# Patient Record
Sex: Female | Born: 1935 | Race: White | Hispanic: No | Smoking: Former smoker
Health system: Southern US, Community
[De-identification: ages and names within clinical notes are randomized; demographics above are authoritative.]

## PROBLEM LIST (undated history)

## (undated) DIAGNOSIS — L570 Actinic keratosis: Secondary | ICD-10-CM

## (undated) DIAGNOSIS — M8580 Other specified disorders of bone density and structure, unspecified site: Secondary | ICD-10-CM

## (undated) DIAGNOSIS — A498 Other bacterial infections of unspecified site: Secondary | ICD-10-CM

## (undated) DIAGNOSIS — G459 Transient cerebral ischemic attack, unspecified: Secondary | ICD-10-CM

## (undated) DIAGNOSIS — E785 Hyperlipidemia, unspecified: Secondary | ICD-10-CM

## (undated) DIAGNOSIS — N182 Chronic kidney disease, stage 2 (mild): Secondary | ICD-10-CM

## (undated) DIAGNOSIS — H9190 Unspecified hearing loss, unspecified ear: Secondary | ICD-10-CM

## (undated) HISTORY — DX: Transient cerebral ischemic attack, unspecified: G45.9

## (undated) HISTORY — DX: Other specified disorders of bone density and structure, unspecified site: M85.80

## (undated) HISTORY — DX: Unspecified hearing loss, unspecified ear: H91.90

## (undated) HISTORY — PX: VESICOVAGINAL FISTULA CLOSURE W/ TAH: SUR271

## (undated) HISTORY — DX: Chronic kidney disease, stage 2 (mild): N18.2

## (undated) HISTORY — DX: Actinic keratosis: L57.0

## (undated) HISTORY — PX: APPENDECTOMY: SHX54

## (undated) HISTORY — DX: Hyperlipidemia, unspecified: E78.5

## (undated) HISTORY — PX: BASAL CELL CARCINOMA EXCISION: SHX1214

## (undated) HISTORY — DX: Other bacterial infections of unspecified site: A49.8

---

## 1999-12-12 ENCOUNTER — Encounter: Payer: Self-pay | Admitting: *Deleted

## 1999-12-12 ENCOUNTER — Encounter: Admission: RE | Admit: 1999-12-12 | Discharge: 1999-12-12 | Payer: Self-pay | Admitting: *Deleted

## 2003-12-15 ENCOUNTER — Ambulatory Visit (HOSPITAL_COMMUNITY): Admission: RE | Admit: 2003-12-15 | Discharge: 2003-12-15 | Payer: Self-pay | Admitting: Gastroenterology

## 2006-06-18 ENCOUNTER — Encounter: Admission: RE | Admit: 2006-06-18 | Discharge: 2006-06-18 | Payer: Self-pay | Admitting: Internal Medicine

## 2006-09-03 ENCOUNTER — Ambulatory Visit: Payer: Self-pay | Admitting: Ophthalmology

## 2006-10-01 ENCOUNTER — Ambulatory Visit: Payer: Self-pay | Admitting: Ophthalmology

## 2012-12-05 ENCOUNTER — Encounter (HOSPITAL_COMMUNITY): Payer: Self-pay

## 2012-12-12 ENCOUNTER — Ambulatory Visit (HOSPITAL_COMMUNITY): Payer: MEDICARE | Attending: Cardiology | Admitting: Radiology

## 2012-12-12 ENCOUNTER — Encounter (HOSPITAL_COMMUNITY): Payer: Self-pay

## 2012-12-12 VITALS — BP 149/77 | HR 66 | Ht 63.5 in | Wt 136.0 lb

## 2012-12-12 DIAGNOSIS — R0602 Shortness of breath: Secondary | ICD-10-CM

## 2012-12-12 DIAGNOSIS — R0609 Other forms of dyspnea: Secondary | ICD-10-CM | POA: Insufficient documentation

## 2012-12-12 DIAGNOSIS — Z8673 Personal history of transient ischemic attack (TIA), and cerebral infarction without residual deficits: Secondary | ICD-10-CM | POA: Insufficient documentation

## 2012-12-12 DIAGNOSIS — E785 Hyperlipidemia, unspecified: Secondary | ICD-10-CM | POA: Insufficient documentation

## 2012-12-12 DIAGNOSIS — R0989 Other specified symptoms and signs involving the circulatory and respiratory systems: Secondary | ICD-10-CM | POA: Insufficient documentation

## 2012-12-12 DIAGNOSIS — R42 Dizziness and giddiness: Secondary | ICD-10-CM | POA: Insufficient documentation

## 2012-12-12 DIAGNOSIS — R0600 Dyspnea, unspecified: Secondary | ICD-10-CM

## 2012-12-12 DIAGNOSIS — Z8249 Family history of ischemic heart disease and other diseases of the circulatory system: Secondary | ICD-10-CM | POA: Insufficient documentation

## 2012-12-12 MED ORDER — REGADENOSON 0.4 MG/5ML IV SOLN
0.4000 mg | Freq: Once | INTRAVENOUS | Status: AC
  Administered 2012-12-12: 0.4 mg via INTRAVENOUS

## 2012-12-12 MED ORDER — TECHNETIUM TC 99M SESTAMIBI GENERIC - CARDIOLITE
11.0000 | Freq: Once | INTRAVENOUS | Status: AC | PRN
  Administered 2012-12-12: 11 via INTRAVENOUS

## 2012-12-12 MED ORDER — TECHNETIUM TC 99M SESTAMIBI GENERIC - CARDIOLITE
33.0000 | Freq: Once | INTRAVENOUS | Status: AC | PRN
  Administered 2012-12-12: 33 via INTRAVENOUS

## 2012-12-12 NOTE — Progress Notes (Signed)
[facility] 3 NUCLEAR MED [address] [place], [place] [address] [phone]    Cardiology Nuclear Med Study  Laura Garrison is a 79 y.o. female     MRN : [id number]     DOB: 07-01-1927  Procedure Date: 12/12/2012  Nuclear Med Background Indication for Stress Test:  Evaluation for Ischemia History:  No previously documented coronary artery disease. Cardiac Risk Factors: Family History - CAD, Lipids and TIA  Symptoms:  Dizziness, DOE, Nausea, Rapid HR and Vomiting   Nuclear Pre-Procedure Caffeine/Decaff Intake:  None NPO After: 8:00pm   Lungs:  Clear. O2 Sat: 96% on room air. IV 0.9% NS with Angio Cath:  20g  IV Site: R Antecubital  IV Started by:  Stanton Kidney, EMT-P  Chest Size (in):  36 Cup Size: C  Height: 5' 3.5" (1.613 m)  Weight:  136 lb (61.689 kg)  BMI:  Body mass index is 23.71 kg/(m^2). Tech Comments:  NA    Nuclear Med Study 1 or 2 day study: 1 day  Stress Test Type:  Lexiscan  Reading MD: Olga Millers, MD  Order Authorizing Provider:  Ralene Ok, MD  Resting Radionuclide: Technetium 11m Sestamibi  Resting Radionuclide Dose: 11.0 mCi   Stress Radionuclide:  Technetium 77m Sestamibi  Stress Radionuclide Dose: 33.0 mCi           Stress Protocol Rest HR: 66 Stress HR: 81  Rest BP: 149/77 Stress BP: 158/70  Exercise Time (min): n/a METS: n/a   Predicted Max HR: 137 bpm % Max HR: 59.12 bpm Rate Pressure Product: 47425   Dose of Adenosine (mg):  n/a Dose of Lexiscan: 0.4 mg  Dose of Atropine (mg): n/a Dose of Dobutamine: n/a mcg/kg/min (at max HR)  Stress Test Technologist: Smiley Houseman, CMA-N  Nuclear Technologist:  Domenic Polite, CNMT     Rest Procedure:  Myocardial perfusion imaging was performed at rest 45 minutes following the intravenous administration of Technetium 75m Sestamibi.  Rest ECG: NSR with nonspecific ST changes.  Stress Procedure:  The patient received IV Lexiscan 0.4 mg over 15-seconds.  Technetium 17m Sestamibi  injected at 30-seconds.  Quantitative spect images were obtained after a 45 minute delay.  Stress ECG: No significant ST segment change suggestive of ischemia.  QPS Raw Data Images:  Acquisition technically good; normal left ventricular size. Stress Images:  Normal homogeneous uptake in all areas of the myocardium. Rest Images:  Normal homogeneous uptake in all areas of the myocardium. Subtraction (SDS):  No evidence of ischemia. Transient Ischemic Dilatation (Normal <1.22):  1.01 Lung/Heart Ratio (Normal <0.45):  0.11  Quantitative Gated Spect Images QGS EDV:  52 ml QGS ESV:  8 ml  Impression Exercise Capacity:  Lexiscan with no exercise. BP Response:  Normal blood pressure response. Clinical Symptoms:  There is dyspnea. ECG Impression:  No significant ST segment change suggestive of ischemia. Comparison with Prior Nuclear Study: No previous nuclear study performed  Overall Impression:  Normal stress nuclear study.  LV Ejection Fraction: 85%.  LV Wall Motion:  NL LV Function; NL Wall Motion  Olga Millers

## 2012-12-13 NOTE — Progress Notes (Signed)
Nuclear report faxed to Dr. Quintin Alto

## 2013-02-04 ENCOUNTER — Ambulatory Visit
Admission: RE | Admit: 2013-02-04 | Discharge: 2013-02-04 | Disposition: A | Discharge status: Home / Self Care | Payer: MEDICARE | Source: Ambulatory Visit | Attending: Internal Medicine | Admitting: Internal Medicine

## 2013-02-04 ENCOUNTER — Other Ambulatory Visit: Payer: Self-pay | Admitting: Internal Medicine

## 2013-02-04 DIAGNOSIS — R0602 Shortness of breath: Secondary | ICD-10-CM

## 2013-02-06 ENCOUNTER — Ambulatory Visit
Admission: RE | Admit: 2013-02-06 | Discharge: 2013-02-06 | Disposition: A | Discharge status: Home / Self Care | Payer: MEDICARE | Source: Ambulatory Visit | Attending: Internal Medicine | Admitting: Internal Medicine

## 2013-02-06 DIAGNOSIS — R0602 Shortness of breath: Secondary | ICD-10-CM

## 2013-02-06 MED ORDER — IOHEXOL 300 MG/ML  SOLN
75.0000 mL | Freq: Once | INTRAMUSCULAR | Status: AC | PRN
  Administered 2013-02-06: 75 mL via INTRAVENOUS

## 2013-02-07 ENCOUNTER — Other Ambulatory Visit: Payer: MEDICARE

## 2013-02-11 ENCOUNTER — Telehealth: Payer: Self-pay | Admitting: Internal Medicine

## 2013-02-11 NOTE — Telephone Encounter (Signed)
i spoke with Laura Garrison. She scheduled pt appt for April 07, 2013 at 2:15. Aware to have pt arrive 15 min early for appt. Nothing further was needed

## 2013-02-11 NOTE — Telephone Encounter (Signed)
Per CY-we are unable to work patient in any sooner; if she can not/does not want to wait then we can let her see another MD for consult for SOB.

## 2013-03-04 ENCOUNTER — Ambulatory Visit (INDEPENDENT_AMBULATORY_CARE_PROVIDER_SITE_OTHER): Payer: Medicare Other | Admitting: Internal Medicine

## 2013-03-04 ENCOUNTER — Encounter: Payer: Self-pay | Admitting: Internal Medicine

## 2013-03-04 VITALS — BP 130/80 | HR 75 | Ht 63.0 in | Wt 140.4 lb

## 2013-03-04 DIAGNOSIS — R0609 Other forms of dyspnea: Secondary | ICD-10-CM

## 2013-03-04 DIAGNOSIS — R0989 Other specified symptoms and signs involving the circulatory and respiratory systems: Secondary | ICD-10-CM

## 2013-03-04 DIAGNOSIS — R0600 Dyspnea, unspecified: Secondary | ICD-10-CM

## 2013-03-04 MED ORDER — THEOPHYLLINE ER 100 MG PO CP24: ORAL_CAPSULE | ORAL | Status: DC

## 2013-03-04 NOTE — Progress Notes (Signed)
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 Referral notes reviewed. She complains of intermittent shortness of breath with need to yawn or take a deep breath occasionally. This has been noticed for 3 months. She walks between 3 and 6 miles most days each week and has no problem, she is feeling rushed, traveling or stressed. Occasional sneeze, but no cough or wheeze. Breathing is comfortable as she wakes in the morning. Cardiac workup was negative/normal including lexican cardiac stress with EF 85%, normal perfusion and EKG.. Chest CT 02/06/2013 showed no worrisome lung parenchymal abnormality, no adenopathy. Coronary artery calcifications were noted Singulair helped some. She lives alone and indicates that she travels quite a bit and is very active "always rushing around". She has had some awareness of nasal congestion without major blockage. Her usual adult height was 5 feet 4 inches and she has lost down to 5 feet 3 inches. CT chest 02/06/13 IMPRESSION:  No worrisome lung parenchymal abnormality.  Coronary artery calcifications.  Atherosclerotic type changes of the thoracic aorta. Ascending  thoracic aorta measures up to 3.5 cm.  Lateral right breast 1.7 x 1 cm structure may be a cyst or  fibroadenoma but is incompletely assessed. Correlation with  mammography recommended.  Original Report Authenticated By: Lacy Duverney, M.D.  Prior to Admission medications   Medication Sig Start Date End Date Taking? Authorizing Provider  Ascorbic Acid (VITAMIN C) 1000 MG tablet Take 1,000 mg by mouth daily.   Yes Historical Provider, MD  aspirin 325 MG tablet Take 325 mg by mouth daily.   Yes Historical Provider, MD  calcium citrate-vitamin D (CITRACAL+D) 315-200 MG-UNIT per tablet Take 1 tablet by mouth daily.   Yes Historical Provider, MD  cholecalciferol (VITAMIN D) 1000 UNITS tablet Take 1,000 Units by mouth daily.   Yes Historical Provider, MD  diphenhydrAMINE  (BENADRYL) 25 mg capsule Take 25 mg by mouth at bedtime as needed for itching.   Yes Historical Provider, MD  docusate sodium (STOOL SOFTENER) 100 MG capsule Take 200 mg by mouth daily.   Yes Historical Provider, MD  montelukast (SINGULAIR) 10 MG tablet Take 10 mg by mouth at bedtime.   Yes Historical Provider, MD  rosuvastatin (CRESTOR) 40 MG tablet Take 40 mg by mouth daily.   Yes Historical Provider, MD  Specialty Vitamins Products (CENTRUM SPECIALIST ENERGY PO) Take 1 tablet by mouth daily.   Yes Historical Provider, MD  vitamin B-12 (CYANOCOBALAMIN) 1000 MCG tablet Take 1,000 mcg by mouth daily.   Yes Historical Provider, MD  mometasone-formoterol (DULERA) 200-5 MCG/ACT AERO Inhale 2 puffs into the lungs 2 (two) times daily. 03/14/13   Waymon Budge, MD  theophylline (THEO-24) 100 MG 24 hr capsule 1 twice daily with meals 03/04/13   Waymon Budge, MD   Past Medical History  Diagnosis Date  . Chronic kidney disease, stage II (mild)   . Hyperlipidemia   . TIA (transient ischemic attack)     status post  . Hearing loss   . Osteopenia   . Escherichia coli (E. coli) infection     treated March 2011  . Actinic keratosis    Past Surgical History  Procedure Laterality Date  . Basal cell carcinoma excision    . Vesicovaginal fistula closure w/ tah    . Appendectomy     Family History  Problem Relation Age of Onset  . Aortic aneurysm Father   . Heart attack Mother   . Healthy Brother   .  Healthy Sister    History   Social History  . Marital Status: Widowed    Spouse Name: N/A    Number of Children: 3  . Years of Education: N/A   Occupational History  . retired    Social History Main Topics  . Smoking status: Former Smoker -- 0.20 packs/day for 2 years    Types: Cigarettes    Quit date: 02/23/1986  . Smokeless tobacco: Never Used  . Alcohol Use: Yes     Comment: drinks a glass of red wine nightly.  . Drug Use: No  . Sexually Active: Not on file   Other Topics  Concern  . Not on file   Social History Narrative  . No narrative on file   ROS-see HPI Constitutional:   No-   weight loss, night sweats, fevers, chills, fatigue, lassitude. HEENT:   No-  headaches, difficulty swallowing, tooth/dental problems, sore throat,       No-  sneezing, itching, ear ache, +nasal congestion, post nasal drip,  CV:  No-   chest pain, orthopnea, PND, swelling in lower extremities, anasarca,                                  dizziness, palpitations Resp: +  shortness of breath with exertion or at rest.              No-   productive cough,  No non-productive cough,  No- coughing up of blood.              No-   change in color of mucus.  No- wheezing.   Skin: No-   rash or lesions. GI:  No-   heartburn, indigestion, abdominal pain, nausea, vomiting, diarrhea,                 change in bowel habits, loss of appetite GU: No-   dysuria, change in color of urine, no urgency or frequency.  No- flank pain. MS:  No-   joint pain or swelling.  No- decreased range of motion.  No- back pain. Neuro-     nothing unusual Psych:  No- change in mood or affect. No depression or anxiety.  No memory loss.  OBJ- Physical Exam General- Alert, Oriented, Affect-appropriate, Distress- none acute, Trim Skin- rash-none, lesions- none, excoriation- none Lymphadenopathy- none Head- atraumatic            Eyes- Gross vision intact, PERRLA, conjunctivae and secretions clear            Ears- Hearing, canals-normal            Nose- Clear, no-Septal dev, mucus, polyps, erosion, perforation             Throat- Mallampati II , mucosa clear , drainage- none, tonsils- atrophic, own teeth Neck- flexible , trachea midline, no stridor , thyroid nl, carotid no bruit Chest - symmetrical excursion , unlabored           Heart/CV- RRR , no murmur , no gallop  , no rub, nl s1 s2                           - JVD- none , edema- none, stasis changes- none, varices- none           Lung- clear to P&A, wheeze- none,  cough- none , dullness-none, rub- none  Chest wall-  Abd- tender-no, distended-no, bowel sounds-present, HSM- no Br/ Gen/ Rectal- Not done, not indicated Extrem- cyanosis- none, clubbing, none, atrophy- none, strength- nl Neuro- grossly intact to observation

## 2013-03-04 NOTE — Patient Instructions (Addendum)
Script sent for theophylline     See if this affects your breathing discomfort

## 2013-03-14 ENCOUNTER — Telehealth: Payer: Self-pay | Admitting: Internal Medicine

## 2013-03-14 MED ORDER — MOMETASONE FURO-FORMOTEROL FUM 200-5 MCG/ACT IN AERO: 2.0000 | INHALATION_SPRAY | Freq: Two times a day (BID) | RESPIRATORY_TRACT | Status: DC

## 2013-03-14 NOTE — Telephone Encounter (Signed)
PT returned triage's call & [phone] (Please try this one first) or [phone]. Antionette Fairy

## 2013-03-14 NOTE — Telephone Encounter (Signed)
We are watching to see what makes a difference.  Instead of theophylline, try sample Dulera 200, 2 puffs then rinse mouth, twice daily.

## 2013-03-14 NOTE — Telephone Encounter (Signed)
lmtcb x1 

## 2013-03-14 NOTE — Telephone Encounter (Signed)
PT WAS SHOWN HOW TO USE INHALER. NOTHING FURTHER WAS NEEDED

## 2013-03-14 NOTE — Telephone Encounter (Signed)
Pt aware of recs. She will be coming in for instruct. Will hold open as she will be in a few minutes.

## 2013-03-14 NOTE — Telephone Encounter (Signed)
I spoke with pt and she stated she is still having difficulty breathing. She is taking the theophylline and it helps sometimes and then it don't. She was reading the pamphlet and it stated she should have an inhaler (per pt). She is calling inquiring about this. Please advise Dr. Maple Hudson thanks  Last OV 03/04/13 Pending 04/22/13  No Known Allergies

## 2013-03-16 DIAGNOSIS — R0600 Dyspnea, unspecified: Secondary | ICD-10-CM | POA: Insufficient documentation

## 2013-03-16 NOTE — Assessment & Plan Note (Addendum)
Complaint of dyspnea without objective correlate so far. She suggests these episodes may correspond to intervals of stress. We also discussed potential for osteoporotic vertebral height loss to restrict lung expansion, causing hypoventilation. I don't think she is at that point yet. I suggested a brief trial of theophylline both as a respiratory stimulant and bronchodilator, but not anticipating long-term use. On return visit will probably set up PFTs once I have had time to fully review her available records.

## 2013-03-24 ENCOUNTER — Telehealth: Payer: Self-pay | Admitting: Internal Medicine

## 2013-03-24 MED ORDER — MOMETASONE FURO-FORMOTEROL FUM 200-5 MCG/ACT IN AERO: 2.0000 | INHALATION_SPRAY | Freq: Two times a day (BID) | RESPIRATORY_TRACT | Status: DC

## 2013-03-24 NOTE — Telephone Encounter (Signed)
Pt aware RX for dulera called in. Nothing further was needed

## 2013-04-01 ENCOUNTER — Institutional Professional Consult (permissible substitution): Payer: Medicare Other | Admitting: Internal Medicine

## 2013-04-07 ENCOUNTER — Institutional Professional Consult (permissible substitution): Payer: MEDICARE | Admitting: Internal Medicine

## 2013-04-22 ENCOUNTER — Ambulatory Visit (INDEPENDENT_AMBULATORY_CARE_PROVIDER_SITE_OTHER): Payer: Medicare Other | Admitting: Internal Medicine

## 2013-04-22 ENCOUNTER — Encounter: Payer: Self-pay | Admitting: Internal Medicine

## 2013-04-22 VITALS — BP 120/80 | HR 67 | Ht 63.0 in | Wt 140.0 lb

## 2013-04-22 DIAGNOSIS — R0600 Dyspnea, unspecified: Secondary | ICD-10-CM

## 2013-04-22 DIAGNOSIS — R0609 Other forms of dyspnea: Secondary | ICD-10-CM

## 2013-04-22 MED ORDER — AZELASTINE-FLUTICASONE 137-50 MCG/ACT NA SUSP: 1.0000 | Freq: Every day | NASAL | Status: DC

## 2013-04-22 NOTE — Progress Notes (Signed)
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 Referral notes reviewed. She complains of intermittent shortness of breath with need to yawn or take a deep breath occasionally. This has been noticed for 3 months. She walks between 3 and 6 miles most days each week and has no problem, she is feeling rushed, traveling or stressed. Occasional sneeze, but no cough or wheeze. Breathing is comfortable as she wakes in the morning. Cardiac workup was negative/normal including lexican cardiac stress with EF 85%, normal perfusion and EKG.. Chest CT 02/06/2013 showed no worrisome lung parenchymal abnormality, no adenopathy. Coronary artery calcifications were noted Singulair helped some. She lives alone and indicates that she travels quite a bit and is very active "always rushing around". She has had some awareness of nasal congestion without major blockage. Her usual adult height was 5 feet 4 inches and she has lost down to 5 feet 3 inches. CT chest 02/06/13 IMPRESSION:  No worrisome lung parenchymal abnormality.  Coronary artery calcifications.  Atherosclerotic type changes of the thoracic aorta. Ascending  thoracic aorta measures up to 3.5 cm.  Lateral right breast 1.7 x 1 cm structure may be a cyst or  fibroadenoma but is incompletely assessed. Correlation with  mammography recommended.  Original Report Authenticated By: Lacy Duverney, M.D.  04/22/13- 51 yoF former light smoker (minister's widow) referred by Dr Ralene Ok for dyspnea FOLLOWS FOR: pt reports out of 6wks she has had 10 good days, 6 bad days (noticed while driving) and 12 "so-so" days-- reports having some congestion a PND as well-- using sinus rinse w some improvement   still at times feels short of breath. She is recognizing more association with a sense of nasal congestion and drainage at those times. Tried Neti pot. Little sneezing. Theophylline had little effect on her dyspnea. Tried to [facility] but  couldn't tell much. Still says she feels quite well walking between 3 and 6 miles a day.  ROS-see HPI Constitutional:   No-   weight loss, night sweats, fevers, chills, fatigue, lassitude. HEENT:   No-  headaches, difficulty swallowing, tooth/dental problems, sore throat,       No-  sneezing, itching, ear ache, +nasal congestion, post nasal drip,  CV:  No-   chest pain, orthopnea, PND, swelling in lower extremities, anasarca,  dizziness, palpitations Resp: +  shortness of breath with exertion or at rest.              No-   productive cough,  No non-productive cough,  No- coughing up of blood.              No-   change in color of mucus.  No- wheezing.   Skin: No-   rash or lesions. GI:  No-   heartburn, indigestion, abdominal pain, nausea, vomiting,  GU:  MS:  No-   joint pain or swelling. . Neuro-     nothing unusual Psych:  No- change in mood or affect. No depression or anxiety.  No memory loss.  OBJ- Physical Exam General- Alert, Oriented, Affect-appropriate, Distress- none acute, Trim, talkative Skin- rash-none, lesions- none, excoriation- none Lymphadenopathy- none Head- atraumatic            Eyes- Gross vision intact, PERRLA, conjunctivae and secretions clear            Ears- Hearing, canals-normal            Nose- Clear, no-Septal dev, mucus, polyps, erosion, perforation  Throat- Mallampati II , mucosa clear , drainage- none, tonsils- atrophic, own teeth Neck- flexible , trachea midline, no stridor , thyroid nl, carotid no bruit Chest - symmetrical excursion , unlabored           Heart/CV- RRR , no murmur , no gallop  , no rub, nl s1 s2                           - JVD- none , edema- none, stasis changes- none, varices- none           Lung- clear to P&A, wheeze- none, cough- none , dullness-none, rub- none           Chest wall-  Abd- Br/ Gen/ Rectal- Not done, not indicated Extrem- cyanosis- none, clubbing, none, atrophy- none, strength- nl Neuro- grossly intact to  observation

## 2013-04-22 NOTE — Patient Instructions (Addendum)
Sample Dymista nasal spray   1-2 puffs each nostril once daily at bedtime  Wait to fill script for Ddulera while we watch to see how you do without it

## 2013-04-30 ENCOUNTER — Telehealth: Payer: Self-pay | Admitting: Internal Medicine

## 2013-04-30 MED ORDER — AZELASTINE-FLUTICASONE 137-50 MCG/ACT NA SUSP: 1.0000 | Freq: Every day | NASAL | Status: AC

## 2013-04-30 NOTE — Telephone Encounter (Signed)
PER CY-- ok to refill dymista # 1 PRN refills and ok for patient to try Dulera 1 puff daily  Spoke with patient  Patient aware of recs Dymista rx sent to verified pharamacy--pt aware dulera directions changed in patient medication list Nothing further needed at this time

## 2013-04-30 NOTE — Telephone Encounter (Signed)
Spoke with patient-- Patient sample dymista at last OV 7/29 Patient states Dymista really helped w her congestion, still hard to catch her breath at times but overall feels it really helped Requesting a Rx of dymista be sent to her pharmacy  Patient also states she spoke w her PCP yesterday,  Would like to know what Dr. Maple Hudson thought about patient decreasing Dulera to one puff once a day  Dr. Maple Hudson please advise on the above Thank you  Last Ov: 7/29 Next OV; 9/29  Pharmacy: [facility]

## 2013-05-03 NOTE — Assessment & Plan Note (Signed)
Dyspnea might reflect nasal stuffiness. We will watch to see how she feels off of Dulera. Consider the possibility that she is feeling CNS dyspnea- might need to get ABG later. Plan-sample Dymista

## 2013-05-19 ENCOUNTER — Telehealth: Payer: Self-pay | Admitting: Internal Medicine

## 2013-05-19 NOTE — Telephone Encounter (Signed)
Pt states she can be reached at [phone] after 11a says the other number she gave isn't working.Laura Garrison

## 2013-05-19 NOTE — Telephone Encounter (Signed)
[email or web address].Jennifer Castillo, CMA  

## 2013-05-19 NOTE — Telephone Encounter (Signed)
Spoke with the pt and she states that she has been using dulera 1 puff on an as needed basis. She states she is having a lot of increased SOB and wants to know can she increase the use of Dulera? She states the dymista has helped her congestion but she is having the SOB.  Please advise. Carron Curie, CMA No Known Allergies

## 2013-05-19 NOTE — Telephone Encounter (Signed)
I called and spoke with pt and made her aware. Nothing further needed 

## 2013-05-19 NOTE — Telephone Encounter (Signed)
Per CY-okay increase Dulera to 2 puffs BID which is standard adult dose.

## 2013-06-20 ENCOUNTER — Telehealth: Payer: Self-pay | Admitting: Internal Medicine

## 2013-06-20 NOTE — Telephone Encounter (Signed)
Katie have you received these records for patients upcoming appt. If not then I can call and request.

## 2013-06-20 NOTE — Telephone Encounter (Signed)
I have not seen any papers on this patient. Thanks :)

## 2013-06-23 ENCOUNTER — Encounter: Payer: Self-pay | Admitting: Internal Medicine

## 2013-06-23 ENCOUNTER — Ambulatory Visit (INDEPENDENT_AMBULATORY_CARE_PROVIDER_SITE_OTHER): Payer: Medicare Other | Admitting: Internal Medicine

## 2013-06-23 VITALS — BP 130/74 | HR 80 | Ht 63.0 in | Wt 136.8 lb

## 2013-06-23 DIAGNOSIS — R0609 Other forms of dyspnea: Secondary | ICD-10-CM

## 2013-06-23 DIAGNOSIS — J301 Allergic rhinitis due to pollen: Secondary | ICD-10-CM

## 2013-06-23 DIAGNOSIS — R0600 Dyspnea, unspecified: Secondary | ICD-10-CM

## 2013-06-23 NOTE — Patient Instructions (Signed)
We suggested you try Dymista nasal spray once daily at bedtime on a maintenance basis while needed                                     Dulera inhaler  2 puffs, twice daily, while needed                                       Zyrtec if you need additional antihistamine  Don't forget that flu shot this Fall !

## 2013-06-23 NOTE — Progress Notes (Signed)
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 Referral notes reviewed. She complains of intermittent shortness of breath with need to yawn or take a deep breath occasionally. This has been noticed for 3 months. She walks between 3 and 6 miles most days each week and has no problem, she is feeling rushed, traveling or stressed. Occasional sneeze, but no cough or wheeze. Breathing is comfortable as she wakes in the morning. Cardiac workup was negative/normal including lexican cardiac stress with EF 85%, normal perfusion and EKG.. Chest CT 02/06/2013 showed no worrisome lung parenchymal abnormality, no adenopathy. Coronary artery calcifications were noted Singulair helped some. She lives alone and indicates that she travels quite a bit and is very active "always rushing around". She has had some awareness of nasal congestion without major blockage. Her usual adult height was 5 feet 4 inches and she has lost down to 5 feet 3 inches. CT chest 02/06/13 IMPRESSION:  No worrisome lung parenchymal abnormality.  Coronary artery calcifications.  Atherosclerotic type changes of the thoracic aorta. Ascending  thoracic aorta measures up to 3.5 cm.  Lateral right breast 1.7 x 1 cm structure may be a cyst or  fibroadenoma but is incompletely assessed. Correlation with  mammography recommended.  Original Report Authenticated By: Lacy Duverney, M.D.  04/22/13- 52 yoF former light smoker (minister's widow) referred by Dr Ralene Ok for dyspnea FOLLOWS FOR: pt reports out of 6wks she has had 10 good days, 6 bad days (noticed while driving) and 12 "so-so" days-- reports having some congestion a PND as well-- using sinus rinse w some improvement   still at times feels short of breath. She is recognizing more association with a sense of nasal congestion and drainage at those times. Tried Neti pot. Little sneezing. Theophylline had little effect on her dyspnea. Tried to [facility] but  couldn't tell much. Still says she feels quite well walking between 3 and 6 miles a day.  06/23/13- 85 yoF former light smoker (minister's widow) referred by Dr Ralene Ok for dyspnea FOLLOWS FOR:  Breathing improved since last OV.  Would like to discuss her rx's today and inflamation of ears and what causes it She says her breathing is better.  Gets flu vaccine at her primary physician. Vertigo 2 weeks ago. ENT evaluation with Dr. Rosalita Chessman induced tomorrow. HCTZ made her dizzy so she quit. His note diagnosed vertigo, vestibular neuronitis, cerumen impaction history of Mnire's disease with sensorineural hearing loss and allergic rhinitis. She says she now feels well. Breathing has been good. Trying Dymista 1 spray each evening.  ROS-see HPI Constitutional:   No-   weight loss, night sweats, fevers, chills, fatigue, lassitude. HEENT:   No-  headaches, difficulty swallowing, tooth/dental problems, sore throat,       No-  sneezing, itching, ear ache, +nasal congestion, post nasal drip,  CV:  No-   chest pain, orthopnea, PND, swelling in lower extremities, anasarca,  dizziness, palpitations Resp: no- shortness of breath with exertion or at rest.              No-   productive cough,  No non-productive cough,  No- coughing up of blood.              No-   change in color of mucus.  No- wheezing.   Skin: No-   rash or lesions. GI:  No-   heartburn, indigestion, abdominal pain, nausea, vomiting,  GU:  MS:  No-   joint pain  or swelling. . Neuro-     nothing unusual Psych:  No- change in mood or affect. No depression or anxiety.  No memory loss.  OBJ- Physical Exam General- Alert, Oriented, Affect-appropriate, Distress- none acute, Trim, talkative Skin- rash-none, lesions- none, excoriation- none Lymphadenopathy- none Head- atraumatic            Eyes- Gross vision intact, PERRLA, conjunctivae and secretions clear. No nystagmus            Ears- Hearing- grossly normal, canals-normal             Nose- Clear, no-Septal dev, mucus, polyps, erosion, perforation             Throat- Mallampati II , mucosa clear , drainage- none, tonsils- atrophic, own teeth Neck- flexible , trachea midline, no stridor , thyroid nl, carotid no bruit Chest - symmetrical excursion , unlabored           Heart/CV- RRR , no murmur , no gallop  , no rub, nl s1 s2                           - JVD- none , edema- none, stasis changes- none, varices- none           Lung- clear to P&A, wheeze- none, cough- none , dullness-none, rub- none           Chest wall-  Abd- Br/ Gen/ Rectal- Not done, not indicated Extrem- cyanosis- none, clubbing, none, atrophy- none, strength- nl Neuro- grossly intact to observation

## 2013-06-23 NOTE — Telephone Encounter (Signed)
Spoke with Dr. Molli Posey office,  They are faxing over OV notes now for patients appt Paperwork received and given Joellen Jersey with patient made her aware of the above Nothing further needed at this time

## 2013-07-02 DIAGNOSIS — J301 Allergic rhinitis due to pollen: Secondary | ICD-10-CM | POA: Insufficient documentation

## 2013-07-02 NOTE — Assessment & Plan Note (Signed)
We can manage this as mild intermittent asthma Plan-resume Dulera if needed, with a discussion

## 2013-07-02 NOTE — Assessment & Plan Note (Signed)
Watch response to Dymista and change of season as we move away from ragweed

## 2013-12-22 ENCOUNTER — Encounter: Payer: Self-pay | Admitting: Internal Medicine

## 2013-12-22 ENCOUNTER — Ambulatory Visit (INDEPENDENT_AMBULATORY_CARE_PROVIDER_SITE_OTHER): Payer: Medicare Other | Admitting: Internal Medicine

## 2013-12-22 VITALS — BP 140/70 | HR 81 | Ht 63.0 in | Wt 137.6 lb

## 2013-12-22 DIAGNOSIS — R0989 Other specified symptoms and signs involving the circulatory and respiratory systems: Secondary | ICD-10-CM

## 2013-12-22 DIAGNOSIS — R0600 Dyspnea, unspecified: Secondary | ICD-10-CM

## 2013-12-22 DIAGNOSIS — R0609 Other forms of dyspnea: Secondary | ICD-10-CM

## 2013-12-22 DIAGNOSIS — J301 Allergic rhinitis due to pollen: Secondary | ICD-10-CM

## 2013-12-22 NOTE — Assessment & Plan Note (Signed)
Has used Dymista before and can use that or an otc antihistamine if needed later this spring.

## 2013-12-22 NOTE — Patient Instructions (Signed)
Please call if needed. Dr Letitia CaulMoreiera can see you going forward, for refills and management of your breathing concerns.

## 2013-12-22 NOTE — Progress Notes (Signed)
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 Referral notes reviewed. She complains of intermittent shortness of breath with need to yawn or take a deep breath occasionally. This has been noticed for 3 months. She walks between 3 and 6 miles most days each week and has no problem, she is feeling rushed, traveling or stressed. Occasional sneeze, but no cough or wheeze. Breathing is comfortable as she wakes in the morning. Cardiac workup was negative/normal including lexican cardiac stress with EF 85%, normal perfusion and EKG.. Chest CT 02/06/2013 showed no worrisome lung parenchymal abnormality, no adenopathy. Coronary artery calcifications were noted Singulair helped some. She lives alone and indicates that she travels quite a bit and is very active "always rushing around". She has had some awareness of nasal congestion without major blockage. Her usual adult height was 5 feet 4 inches and she has lost down to 5 feet 3 inches. CT chest 02/06/13 IMPRESSION:  No worrisome lung parenchymal abnormality.  Coronary artery calcifications.  Atherosclerotic type changes of the thoracic aorta. Ascending  thoracic aorta measures up to 3.5 cm.  Lateral right breast 1.7 x 1 cm structure may be a cyst or  fibroadenoma but is incompletely assessed. Correlation with  mammography recommended.  Original Report Authenticated By: Lacy Duverney, M.D.  04/22/13- 49 yoF former light smoker (minister's widow) referred by Dr Ralene Ok for dyspnea FOLLOWS FOR: pt reports out of 6wks she has had 10 good days, 6 bad days (noticed while driving) and 12 "so-so" days-- reports having some congestion a PND as well-- using sinus rinse w some improvement   still at times feels short of breath. She is recognizing more association with a sense of nasal congestion and drainage at those times. Tried Neti pot. Little sneezing. Theophylline had little effect on her dyspnea. Tried to [facility] but  couldn't tell much. Still says she feels quite well walking between 3 and 6 miles a day.  06/23/13- 85 yoF former light smoker (minister's widow) referred by Dr Ralene Ok for dyspnea FOLLOWS FOR:  Breathing improved since last OV.  Would like to discuss her rx's today and inflamation of ears and what causes it She says her breathing is better.  Gets flu vaccine at her primary physician. Vertigo 2 weeks ago. ENT evaluation with Dr. Rosalita Chessman induced tomorrow. HCTZ made her dizzy so she quit. His note diagnosed vertigo, vestibular neuronitis, cerumen impaction history of Mnire's disease with sensorineural hearing loss and allergic rhinitis. She says she now feels well. Breathing has been good. Trying Dymista 1 spray each evening.  12/22/13- 86 yoF former light smoker (minister's widow) referred by Dr Ralene Ok for dyspnea FOLLOWS FOR:  Breathing doing well--No concerns today She is headed to [place]- daughter had GB cancer surgery- as soon as she leaves here. No dyspnea and reports stress test good. Had aortic atherosclerosis on CT last year. Only feels dyspnea if stressed.  Uses Dulera 200 1x daily and rarely needs rescue inhaler. Dizziness resolved.   ROS-see HPI Constitutional:   No-   weight loss, night sweats, fevers, chills, fatigue, lassitude. HEENT:   No-  headaches, difficulty swallowing, tooth/dental problems, sore throat,       No-  sneezing, itching, ear ache, +nasal congestion, post nasal drip,  CV:  No-   chest pain, orthopnea, PND, swelling in lower extremities, anasarca,  dizziness, palpitations Resp: no- shortness of breath with exertion or at rest.  No-   productive cough,  No non-productive cough,  No- coughing up of blood.              No-   change in color of mucus.  No- wheezing.   Skin: No-   rash or lesions. GI:  No-   heartburn, indigestion, abdominal pain, nausea, vomiting,  GU:  MS:  No-   joint pain or swelling. . Neuro-     nothing  unusual Psych:  No- change in mood or affect. No depression or anxiety.  No memory loss.  OBJ- Physical Exam General- Alert, Oriented, Affect-appropriate, Distress- none acute, Trim, talkative Skin- rash-none, lesions- none, excoriation- none Lymphadenopathy- none Head- atraumatic            Eyes- Gross vision intact, PERRLA, conjunctivae and secretions clear. No nystagmus            Ears- Hearing- grossly normal, canals-normal            Nose- Clear, no-Septal dev, mucus, polyps, erosion, perforation             Throat- Mallampati II , mucosa clear , drainage- none, tonsils- atrophic, own teeth Neck- flexible , trachea midline, no stridor , thyroid nl, carotid no bruit Chest - symmetrical excursion , unlabored           Heart/CV- RRR , no murmur , no gallop  , no rub, nl s1 s2                           - JVD- none , edema- none, stasis changes- none, varices- none           Lung- clear to P&A, wheeze- none, cough- none , dullness-none, rub- none           Chest wall-  Abd- Br/ Gen/ Rectal- Not done, not indicated Extrem- cyanosis- none, clubbing, none, atrophy- none, strength- nl Neuro- grossly intact to observation

## 2013-12-22 NOTE — Assessment & Plan Note (Signed)
Possibly mild intermittent asthma. She feels low dose Dulera controls this. She is satisfied. I suggested she can be followed for this problem by Dr Ludwig ClarksMoreira and I will be happy to see her again if needed. She was comfortable with this.

## 2013-12-28 IMAGING — CR DG CHEST 2V
2 series · 2 of 2 positions shown · non-contrast
Comparison: None.

CLINICAL DATA: Short of breath.

CHEST - 2 VIEW

[view not recorded (1 of 2)]
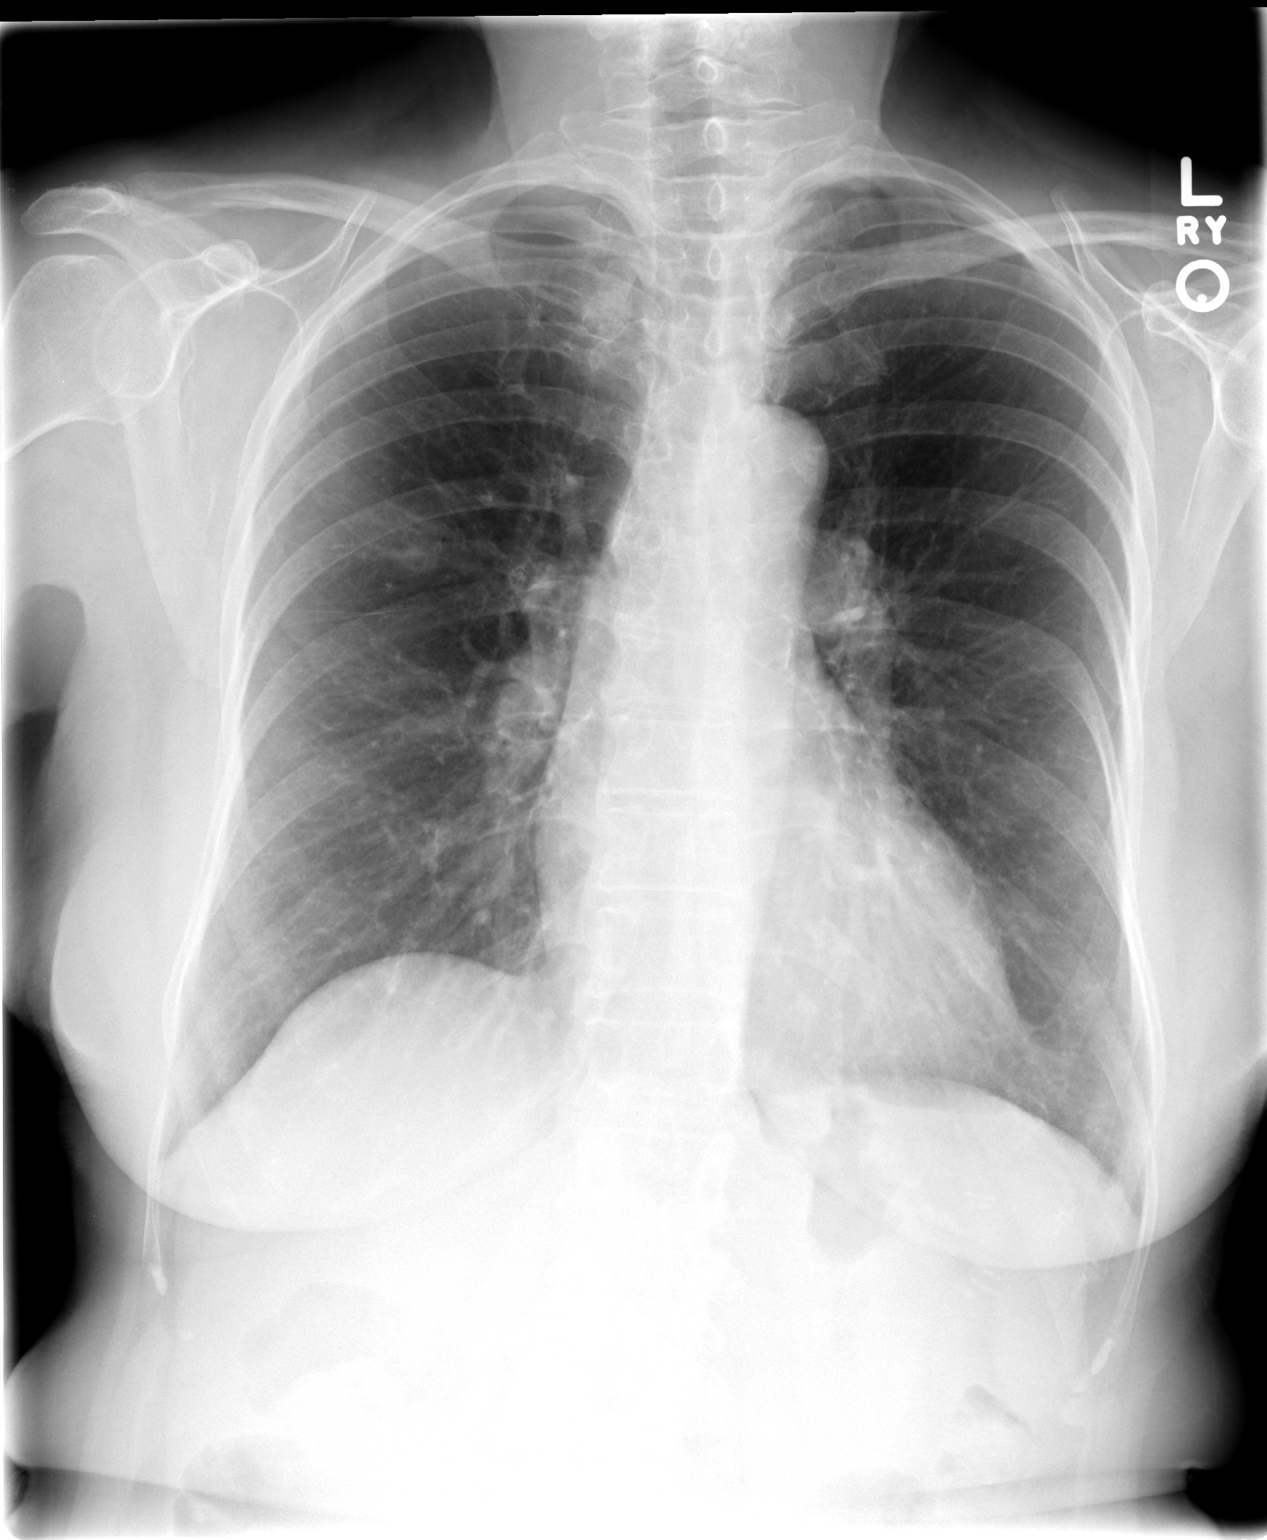

[view not recorded (2 of 2)]
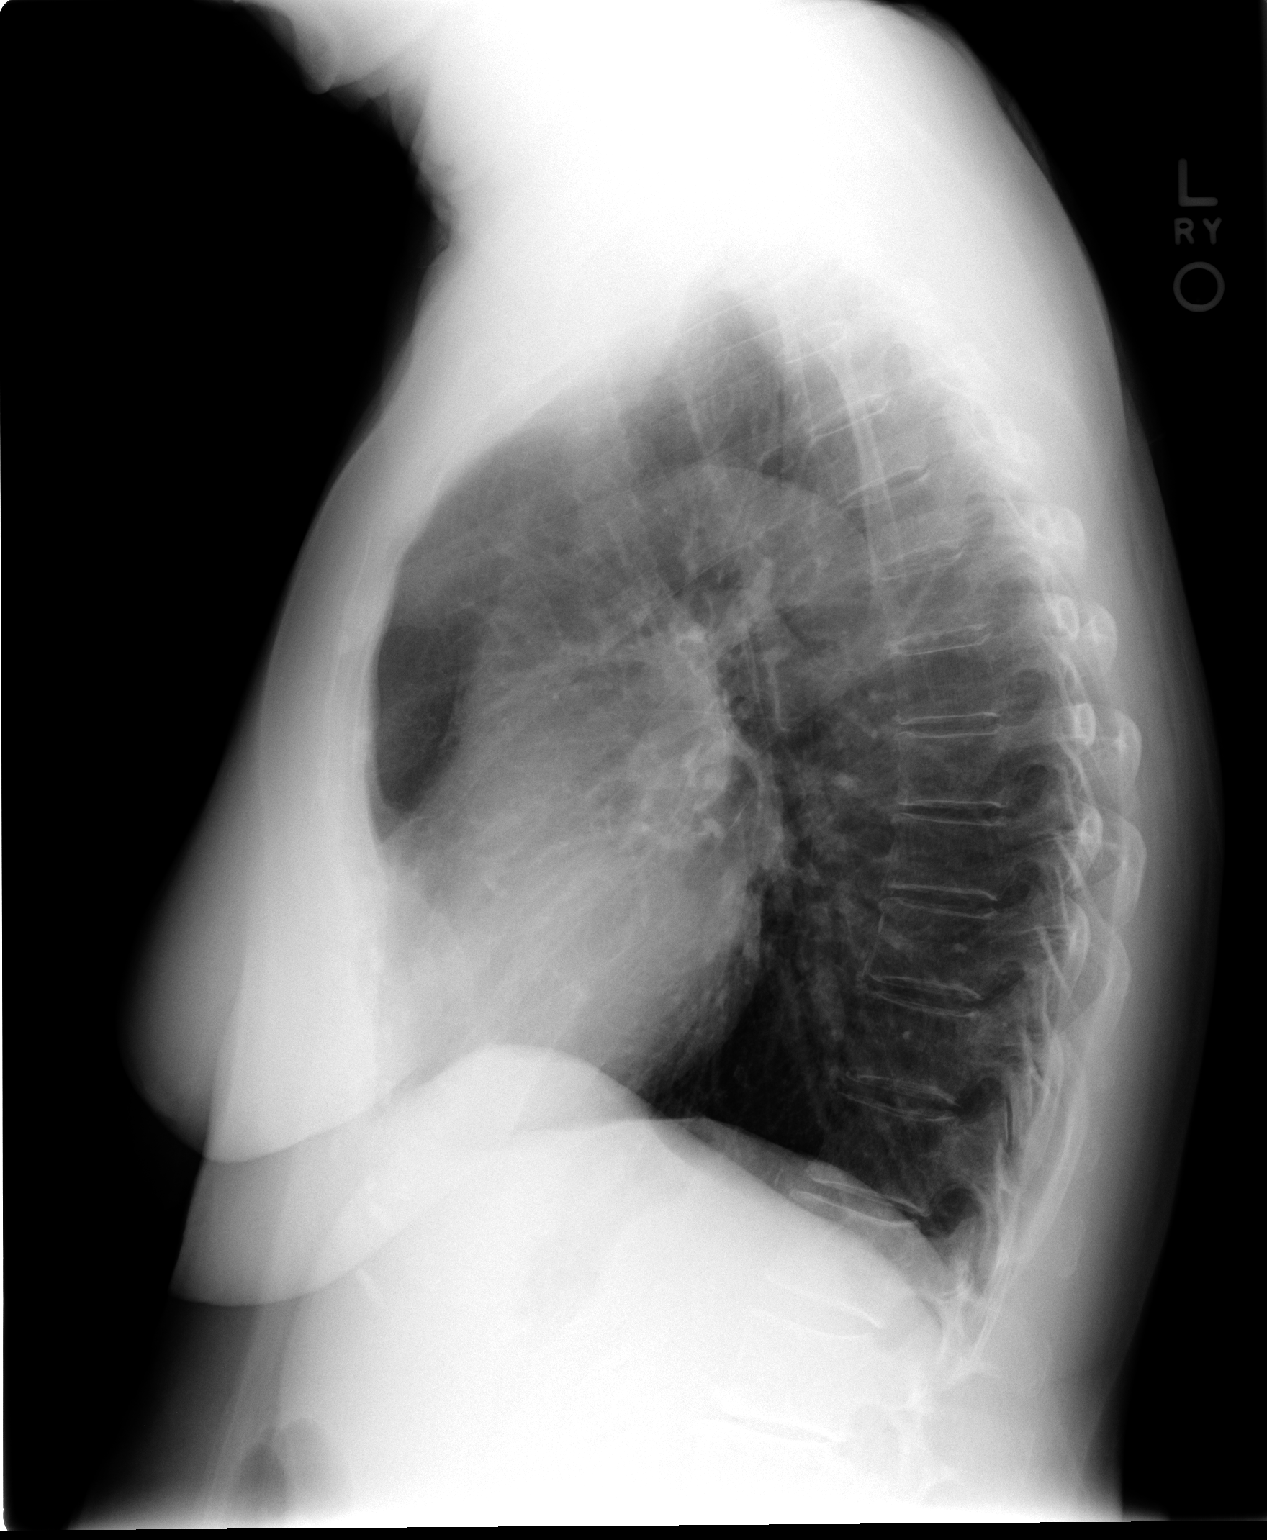

[2 of 2 positions shown; findings below may reference images not displayed]

FINDINGS: There is an ill-defined focus of opacity in the inferior
right upper lobe seen on the frontal view.  This is difficult to
identify on the lateral view.  There is no plain film evidence of
adenopathy.  Chronic bronchitic changes are present.
Cardiopericardial silhouette is within normal limits.  Mediastinal
contours normal. Calcification of the tracheobronchial tree.
IMPRESSION: Small focus of ill-defined airspace density in the right upper
lobe.  This is suspicious for a small focus of pneumonia.  If
infectious symptoms are present, consider treatment and repeat the
chest x-ray 8 weeks.  If there are no infectious symptoms,
noncontrast chest CT should be considered for further assessment.

## 2013-12-30 IMAGING — CT CT CHEST W/ CM
2 of 3 series · 15 of 36 positions shown, 18 images · IV contrast (75CC OMNI 300)
Comparison: 02/04/2013 chest x-ray.

CLINICAL DATA: Evaluate density right upper lobe noted on recent
chest x-ray.

BUN and creatinine were obtained on site at [HOSPITAL] at
[HOSPITAL]..
Results:  BUN 9.0 mg/dL,  Creatinine 0.8. mg/dL.
CT CHEST WITH CONTRAST
TECHNIQUE: Multidetector CT imaging of the chest was performed
following the standard protocol during bolus administration of
intravenous contrast.
Contrast: 75mL OMNIPAQUE IOHEXOL 300 MG/ML  SOLN

[Series 2: chest with · axial · 0.72mm/px · z∈[-273,-28]mm · 12 of 59 slices shown, 15 images]
[im 5/59  mediastinal]
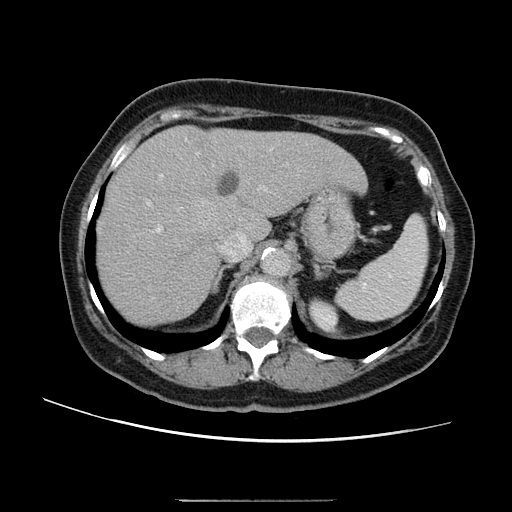
[im 5/59  lung]
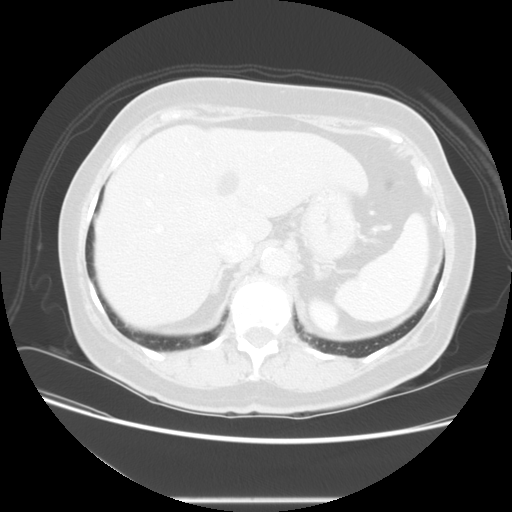
[im 9/59  lung]
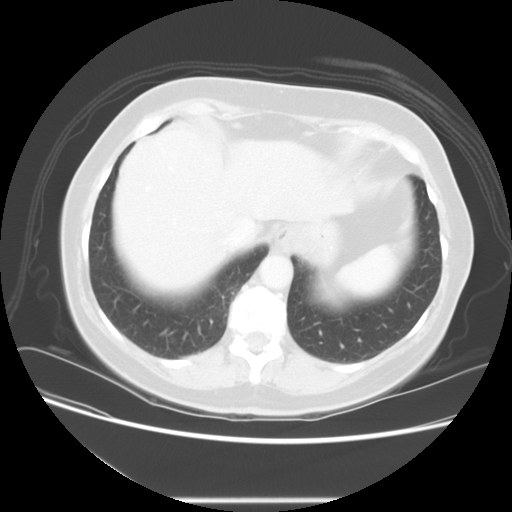
[im 13/59  lung]
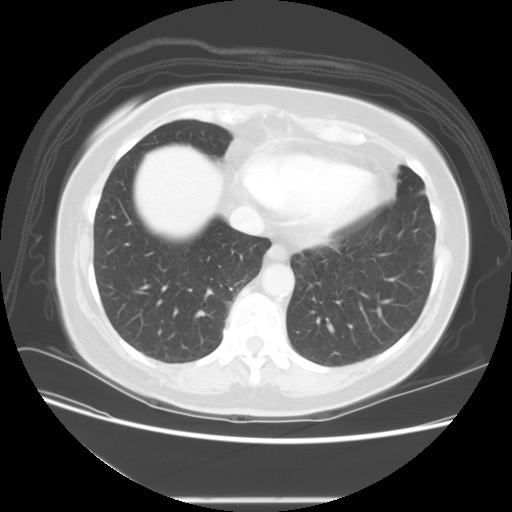
[im 18/59  lung]
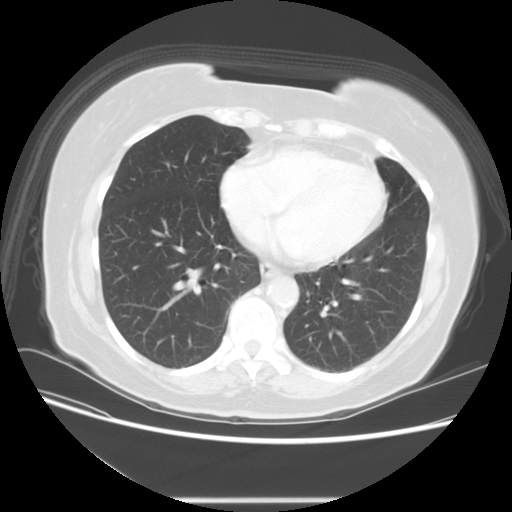
[im 22/59  mediastinal]
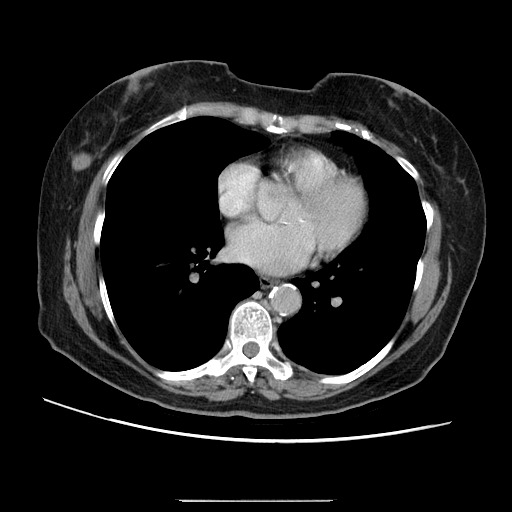
[im 22/59  lung]
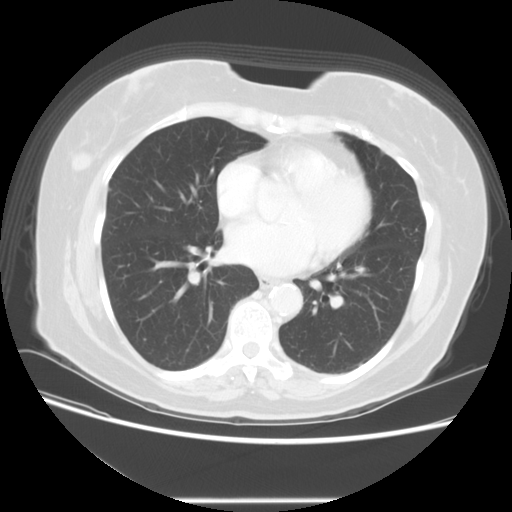
[im 26/59  lung]
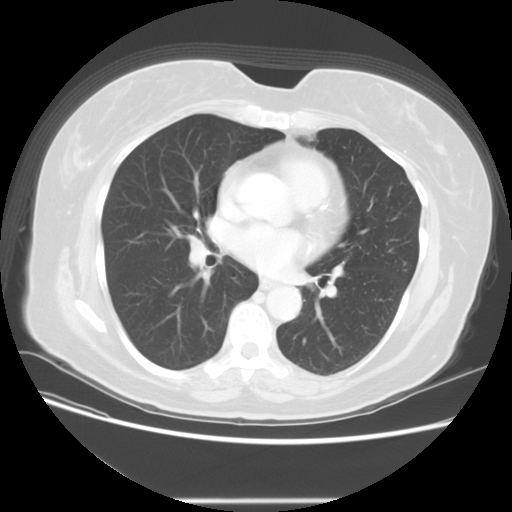
[im 33/59  lung]
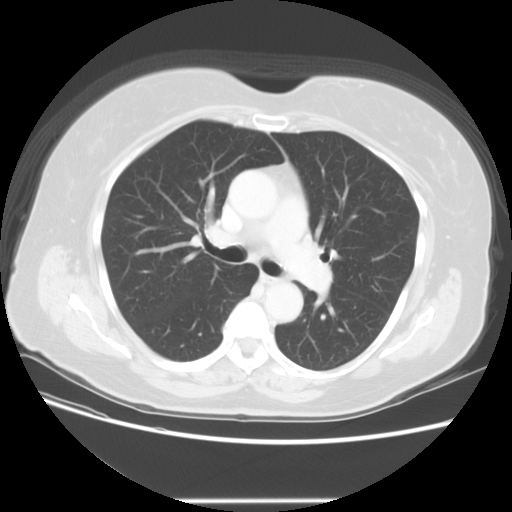
[im 37/59  lung]
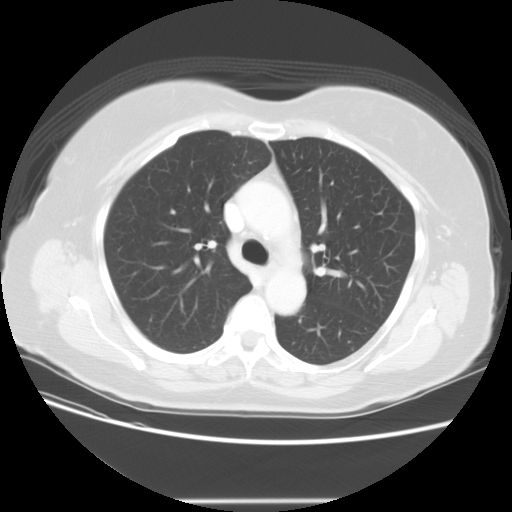
[im 41/59  mediastinal]
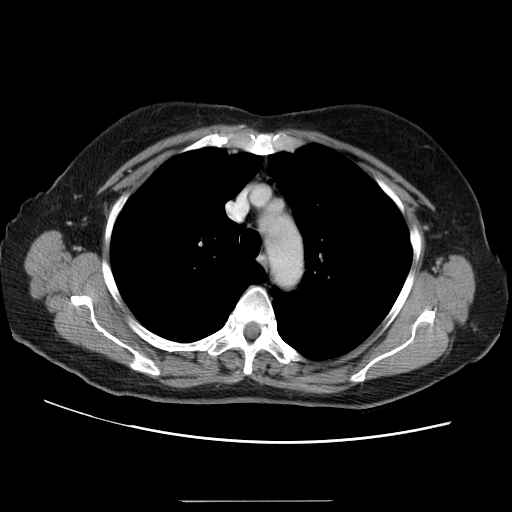
[im 41/59  lung]
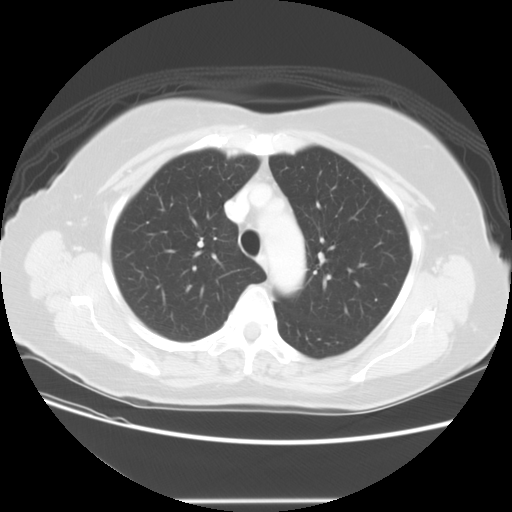
[im 46/59  lung]
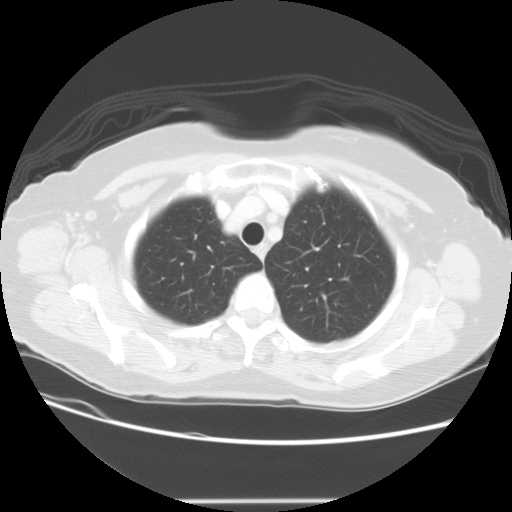
[im 50/59  lung]
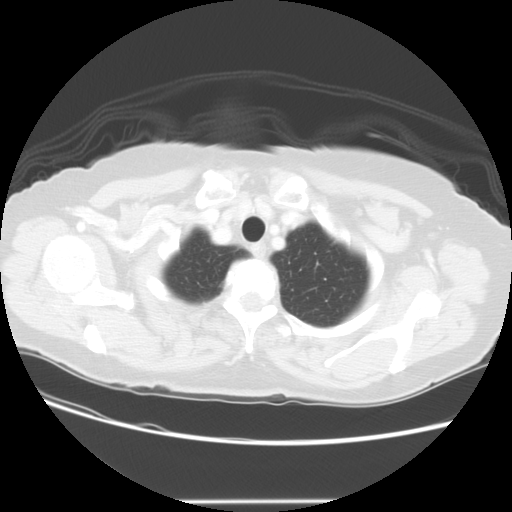
[im 54/59  lung]
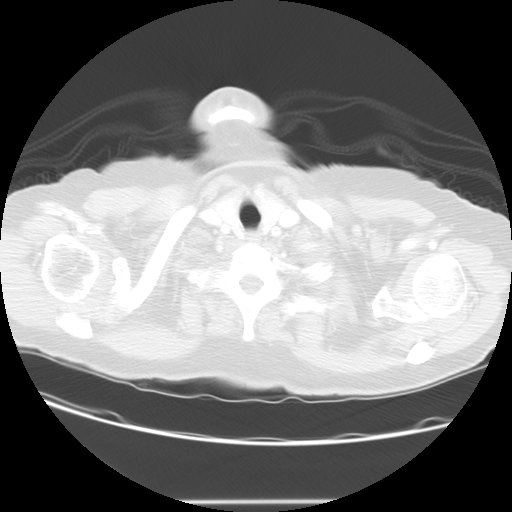

[Series 401: coronal · coronal · 0.72mm/px · 3 of 100 slices shown]
[im 20/100  lung]
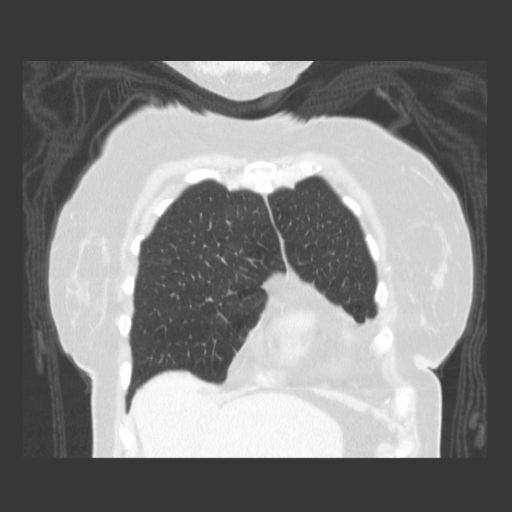
[im 40/100  lung]
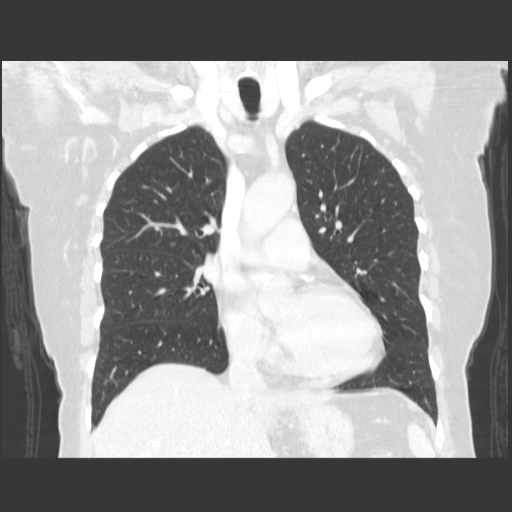
[im 60/100  lung]
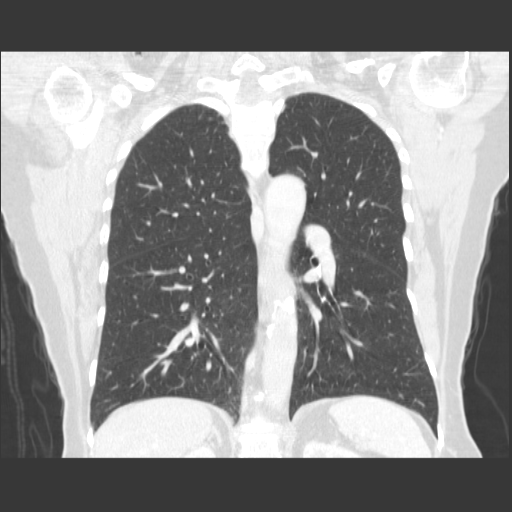

[15 of 36 positions shown; findings below may reference images not displayed]

FINDINGS: No worrisome lung parenchymal abnormality.

No mediastinal, hilar or axillary adenopathy.

Coronary artery calcifications.

Atherosclerotic type changes of the thoracic aorta.  Ascending
thoracic aorta measures up to 3.5 cm.

Visualized upper abdominal structures without worrisome
abnormality.  Liver cysts incidentally noted.

No bony destructive lesion.

Lateral right breast 1.7 x 1 cm structure may be a cyst or
fibroadenoma but is incompletely assessed.  Correlation with
mammography recommended.
IMPRESSION: No worrisome lung parenchymal abnormality.

Coronary artery calcifications.

Atherosclerotic type changes of the thoracic aorta.  Ascending
thoracic aorta measures up to 3.5 cm.

Lateral right breast 1.7 x 1 cm structure may be a cyst or
fibroadenoma but is incompletely assessed.  Correlation with
mammography recommended.

## 2019-10-16 ENCOUNTER — Ambulatory Visit: Payer: Medicare Other | Attending: Internal Medicine

## 2019-10-16 DIAGNOSIS — Z23 Encounter for immunization: Secondary | ICD-10-CM

## 2019-10-16 NOTE — Progress Notes (Signed)
   Covid-19 Vaccination Clinic  Name:  Nira Visscher    MRN: [id number] DOB: 1927-09-23  10/16/2019  Ms. Stamant was observed post Covid-19 immunization for 15 minutes without incidence. She was provided with Vaccine Information Sheet and instruction to access the V-Safe system.   Ms. Wish was instructed to call 911 with any severe reactions post vaccine: Marland Kitchen Difficulty breathing  . Swelling of your face and throat  . A fast heartbeat  . A bad rash all over your body  . Dizziness and weakness    Immunizations Administered    Name Date Dose VIS Date Route   Pfizer COVID-19 Vaccine 10/16/2019  3:44 PM 0.3 mL 09/05/2019 Intramuscular   Manufacturer: [employer], [employer]   Lot: [id number]   NDC: [id number]

## 2019-11-07 ENCOUNTER — Ambulatory Visit: Payer: Medicare Other | Attending: Internal Medicine

## 2019-11-07 DIAGNOSIS — Z23 Encounter for immunization: Secondary | ICD-10-CM

## 2019-11-07 NOTE — Progress Notes (Signed)
   Covid-19 Vaccination Clinic  Name:  Laura Garrison    MRN: [id number] DOB: 06/24/1927  11/07/2019  Laura Garrison was observed post Covid-19 immunization for 15 minutes without incidence. She was provided with Vaccine Information Sheet and instruction to access the V-Safe system.   Laura Garrison was instructed to call 911 with any severe reactions post vaccine: Marland Kitchen Difficulty breathing  . Swelling of your face and throat  . A fast heartbeat  . A bad rash all over your body  . Dizziness and weakness    Immunizations Administered    Name Date Dose VIS Date Route   Pfizer COVID-19 Vaccine 11/07/2019  1:20 PM 0.3 mL 09/05/2019 Intramuscular   Manufacturer: [employer], [employer]   Lot: [id number]   NDC: [id number]
